# Patient Record
Sex: Female | Born: 1965 | Race: White | Hispanic: No | Smoking: Current every day smoker
Health system: Southern US, Community
[De-identification: ages and names within clinical notes are randomized; demographics above are authoritative.]

## PROBLEM LIST (undated history)

## (undated) DIAGNOSIS — E079 Disorder of thyroid, unspecified: Secondary | ICD-10-CM

## (undated) DIAGNOSIS — O009 Unspecified ectopic pregnancy without intrauterine pregnancy: Secondary | ICD-10-CM

## (undated) DIAGNOSIS — E039 Hypothyroidism, unspecified: Secondary | ICD-10-CM

## (undated) DIAGNOSIS — E7800 Pure hypercholesterolemia, unspecified: Secondary | ICD-10-CM

## (undated) HISTORY — PX: ECTOPIC PREGNANCY SURGERY: SHX613

## (undated) HISTORY — PX: CHOLECYSTECTOMY: SHX55

---

## 2015-06-12 ENCOUNTER — Other Ambulatory Visit (HOSPITAL_COMMUNITY): Payer: Self-pay | Admitting: Physician Assistant

## 2015-06-12 DIAGNOSIS — R928 Other abnormal and inconclusive findings on diagnostic imaging of breast: Secondary | ICD-10-CM

## 2015-06-12 DIAGNOSIS — N6310 Unspecified lump in the right breast, unspecified quadrant: Secondary | ICD-10-CM

## 2015-06-19 ENCOUNTER — Other Ambulatory Visit (HOSPITAL_COMMUNITY): Payer: Self-pay | Admitting: Physician Assistant

## 2015-06-19 DIAGNOSIS — N6489 Other specified disorders of breast: Secondary | ICD-10-CM

## 2015-06-19 DIAGNOSIS — R928 Other abnormal and inconclusive findings on diagnostic imaging of breast: Secondary | ICD-10-CM

## 2015-07-07 ENCOUNTER — Other Ambulatory Visit (HOSPITAL_COMMUNITY): Payer: Self-pay | Admitting: Physician Assistant

## 2015-07-07 ENCOUNTER — Ambulatory Visit (HOSPITAL_COMMUNITY)
Admission: RE | Admit: 2015-07-07 | Discharge: 2015-07-07 | Disposition: A | Discharge status: Home / Self Care | Payer: BC Managed Care – PPO | Source: Ambulatory Visit | Attending: Physician Assistant | Admitting: Physician Assistant

## 2015-07-07 DIAGNOSIS — R928 Other abnormal and inconclusive findings on diagnostic imaging of breast: Secondary | ICD-10-CM

## 2015-07-07 DIAGNOSIS — N6310 Unspecified lump in the right breast, unspecified quadrant: Secondary | ICD-10-CM

## 2015-07-07 DIAGNOSIS — N6001 Solitary cyst of right breast: Secondary | ICD-10-CM | POA: Insufficient documentation

## 2015-07-07 DIAGNOSIS — N6489 Other specified disorders of breast: Secondary | ICD-10-CM

## 2015-10-29 ENCOUNTER — Observation Stay (HOSPITAL_COMMUNITY)
Admission: EM | Admit: 2015-10-29 | Discharge: 2015-10-30 | Disposition: A | Discharge status: Home / Self Care | Payer: BC Managed Care – PPO | Attending: Internal Medicine | Admitting: Internal Medicine

## 2015-10-29 ENCOUNTER — Encounter (HOSPITAL_COMMUNITY): Payer: Self-pay | Admitting: Emergency Medicine

## 2015-10-29 ENCOUNTER — Emergency Department (HOSPITAL_COMMUNITY): Payer: BC Managed Care – PPO

## 2015-10-29 DIAGNOSIS — E785 Hyperlipidemia, unspecified: Secondary | ICD-10-CM | POA: Diagnosis not present

## 2015-10-29 DIAGNOSIS — E7800 Pure hypercholesterolemia, unspecified: Secondary | ICD-10-CM | POA: Diagnosis not present

## 2015-10-29 DIAGNOSIS — F17210 Nicotine dependence, cigarettes, uncomplicated: Secondary | ICD-10-CM | POA: Diagnosis not present

## 2015-10-29 DIAGNOSIS — Z9049 Acquired absence of other specified parts of digestive tract: Secondary | ICD-10-CM | POA: Insufficient documentation

## 2015-10-29 DIAGNOSIS — R079 Chest pain, unspecified: Principal | ICD-10-CM | POA: Diagnosis present

## 2015-10-29 DIAGNOSIS — Z7951 Long term (current) use of inhaled steroids: Secondary | ICD-10-CM | POA: Insufficient documentation

## 2015-10-29 DIAGNOSIS — Z8249 Family history of ischemic heart disease and other diseases of the circulatory system: Secondary | ICD-10-CM | POA: Diagnosis not present

## 2015-10-29 DIAGNOSIS — Z79899 Other long term (current) drug therapy: Secondary | ICD-10-CM | POA: Insufficient documentation

## 2015-10-29 DIAGNOSIS — F17200 Nicotine dependence, unspecified, uncomplicated: Secondary | ICD-10-CM | POA: Diagnosis present

## 2015-10-29 DIAGNOSIS — E039 Hypothyroidism, unspecified: Secondary | ICD-10-CM | POA: Diagnosis not present

## 2015-10-29 HISTORY — DX: Pure hypercholesterolemia, unspecified: E78.00

## 2015-10-29 HISTORY — DX: Disorder of thyroid, unspecified: E07.9

## 2015-10-29 HISTORY — DX: Unspecified ectopic pregnancy without intrauterine pregnancy: O00.90

## 2015-10-29 LAB — CBC
HEMATOCRIT: 42.4 % (ref 36.0–46.0)
Hemoglobin: 14.7 g/dL (ref 12.0–15.0)
MCH: 32.3 pg (ref 26.0–34.0)
MCHC: 34.7 g/dL (ref 30.0–36.0)
MCV: 93.2 fL (ref 78.0–100.0)
Platelets: 243 10*3/uL (ref 150–400)
RBC: 4.55 MIL/uL (ref 3.87–5.11)
RDW: 12.6 % (ref 11.5–15.5)
WBC: 9.5 10*3/uL (ref 4.0–10.5)

## 2015-10-29 LAB — BASIC METABOLIC PANEL
Anion gap: 10 (ref 5–15)
BUN: 14 mg/dL (ref 6–20)
CHLORIDE: 105 mmol/L (ref 101–111)
CO2: 24 mmol/L (ref 22–32)
CREATININE: 0.55 mg/dL (ref 0.44–1.00)
Calcium: 9.2 mg/dL (ref 8.9–10.3)
GFR calc Af Amer: >60 mL/min (ref >60)
GFR calc non Af Amer: >60 mL/min (ref >60)
GLUCOSE: 99 mg/dL (ref 65–99)
POTASSIUM: 3.9 mmol/L (ref 3.5–5.1)
Sodium: 139 mmol/L (ref 135–145)

## 2015-10-29 LAB — TROPONIN I: Troponin I: <0.03 ng/mL (ref <0.031)

## 2015-10-29 LAB — D-DIMER, QUANTITATIVE: D-Dimer, Quant: <0.27 ug/mL-FEU (ref 0.00–0.50)

## 2015-10-29 MED ORDER — LEVOTHYROXINE SODIUM 25 MCG PO TABS: 125.0000 ug | ORAL_TABLET | Freq: Every day | ORAL | Status: DC

## 2015-10-29 MED ORDER — ONDANSETRON HCL 4 MG/2ML IJ SOLN: 4.0000 mg | Freq: Four times a day (QID) | INTRAMUSCULAR | Status: DC | PRN

## 2015-10-29 MED ORDER — ASPIRIN EC 325 MG PO TBEC
325.0000 mg | DELAYED_RELEASE_TABLET | Freq: Every day | ORAL | Status: DC
  Administered 2015-10-29: 325 mg via ORAL
  Filled 2015-10-29 (×2): qty 1

## 2015-10-29 MED ORDER — ENOXAPARIN SODIUM 40 MG/0.4ML ~~LOC~~ SOLN
40.0000 mg | SUBCUTANEOUS | Status: DC
  Administered 2015-10-29: 40 mg via SUBCUTANEOUS
  Filled 2015-10-29: qty 0.4

## 2015-10-29 MED ORDER — SODIUM CHLORIDE 0.9 % IV SOLN
INTRAVENOUS | Status: DC
  Administered 2015-10-29: 18:00:00 via INTRAVENOUS

## 2015-10-29 MED ORDER — PRAVASTATIN SODIUM 40 MG PO TABS
40.0000 mg | ORAL_TABLET | Freq: Every day | ORAL | Status: DC
  Administered 2015-10-29: 40 mg via ORAL
  Filled 2015-10-29: qty 1

## 2015-10-29 MED ORDER — CITALOPRAM HYDROBROMIDE 20 MG PO TABS
20.0000 mg | ORAL_TABLET | Freq: Every day | ORAL | Status: DC
  Administered 2015-10-29: 20 mg via ORAL
  Filled 2015-10-29 (×2): qty 1

## 2015-10-29 MED ORDER — ASPIRIN 81 MG PO CHEW
324.0000 mg | CHEWABLE_TABLET | Freq: Once | ORAL | Status: AC
  Administered 2015-10-29: 324 mg via ORAL
  Filled 2015-10-29: qty 4

## 2015-10-29 MED ORDER — ACETAMINOPHEN 325 MG PO TABS: 650.0000 mg | ORAL_TABLET | ORAL | Status: DC | PRN

## 2015-10-29 NOTE — H&P (Signed)
[facility] History and Physical  Marie Taylor ZOX:[id number] DOB: 08-02-1966 DOA: 10/29/2015  Referring physician: Elson AreasLeslie K Sofia, PA PCP: Abran RichardBAUCOM, JENNY B, PA-C   Chief Complaint: chest pain  HPI: Marie Taylor is a 49 y.o. female with history of hyperlipidemia, tobacco use, family history of premature coronary disease presents with a hospital with complaints of chest pain. Patient reports intermittent chest pain for the past week lasting anywhere from a few minutes to 20 minutes. It is located in the left chest, radiating up into her neck and shoulder. She is not having associated shortness of breath or diaphoresis. She notes that her pain becomes worse on exertion and improves with rest. She was evaluated in the emergency room today and was subsequently referred for admission.   Review of Systems:  Pertinent positives as per HPI, otherwise negative  Past Medical History  Diagnosis Date  . Hypercholesteremia   . Thyroid disease     Hypothyroidism  . Ectopic pregnancy    Past Surgical History  Procedure Laterality Date  . Cholecystectomy    . Ectopic pregnancy surgery     Social History:  reports that she has been smoking Cigarettes.  She has been smoking about 0.50 packs per day. She does not have any smokeless tobacco history on file. She reports that she drinks alcohol. She reports that she does not use illicit drugs.  No Known Allergies  Family History  Problem Relation Age of Onset  . Coronary artery disease Sister [id number]    stent placement    Prior to Admission medications   Medication Sig Start Date End Date Taking? Authorizing Provider  albuterol (PROVENTIL HFA;VENTOLIN HFA) 108 (90 BASE) MCG/ACT inhaler Inhale 2 puffs into the lungs every 6 (six) hours as needed for wheezing or shortness of breath.   Yes Historical Provider, MD  citalopram (CELEXA) 20 MG tablet Take 20 mg by mouth daily.   Yes Historical Provider, MD  levothyroxine (SYNTHROID, LEVOTHROID) 125  MCG tablet Take 125 mcg by mouth daily before breakfast.   Yes Historical Provider, MD  pravastatin (PRAVACHOL) 40 MG tablet Take 40 mg by mouth daily.   Yes Historical Provider, MD   Physical Exam: Filed Vitals:   10/29/15 1158 10/29/15 1200 10/29/15 1230 10/29/15 1315  BP: 106/75 118/84 124/76 124/76  Pulse: 63 65 62 58  Temp: 98.2 F (36.8 C)  98.2 F (36.8 C) 98.2 F (36.8 C)  TempSrc: Oral     Resp: 14 19 20 16   Height:      Weight:      SpO2: 100% 100% 100% 100%    Wt Readings from Last 3 Encounters:  10/29/15 87.091 kg (192 lb)    General:  Appears calm and comfortable Eyes: PERRL, normal lids, irises & conjunctiva ENT: grossly normal hearing, lips & tongue Neck: no LAD, masses or thyromegaly Cardiovascular: RRR, no m/r/g. No LE edema. Telemetry: SR, no arrhythmias  Respiratory: CTA bilaterally, no w/r/r. Normal respiratory effort. Abdomen: soft, ntnd Skin: no rash or induration seen on limited exam Musculoskeletal: grossly normal tone BUE/BLE Psychiatric: grossly normal mood and affect, speech fluent and appropriate Neurologic: grossly non-focal.          Labs on Admission:  Basic Metabolic Panel:  Recent Labs Lab 10/29/15 0959  NA 139  K 3.9  CL 105  CO2 24  GLUCOSE 99  BUN 14  CREATININE 0.55  CALCIUM 9.2   Liver Function Tests: No results for input(s): AST, ALT, ALKPHOS, BILITOT, PROT, ALBUMIN in  the last 168 hours. No results for input(s): LIPASE, AMYLASE in the last 168 hours. No results for input(s): AMMONIA in the last 168 hours. CBC:  Recent Labs Lab 10/29/15 0959  WBC 9.5  HGB 14.7  HCT 42.4  MCV 93.2  PLT 243   Cardiac Enzymes:  Recent Labs Lab 10/29/15 0959 10/29/15 1321  TROPONINI <0.03 <0.03    BNP (last 3 results) No results for input(s): BNP in the last 8760 hours.  ProBNP (last 3 results) No results for input(s): PROBNP in the last 8760 hours.  CBG: No results for input(s): GLUCAP in the last 168  hours.  Radiological Exams on Admission: Dg Chest 2 View  10/29/2015  CLINICAL DATA:  Chest pain and hypertension EXAM: CHEST  2 VIEW COMPARISON:  None. FINDINGS: Lungs are clear. Heart size and pulmonary vascularity are normal. No adenopathy. No pneumothorax. No bone lesions. IMPRESSION: No edema or consolidation. Electronically Signed   By: Bretta Bang III M.D.   On: 10/29/2015 11:00    EKG: Independently reviewed. No acute changes  Assessment/Plan Principal Problem:   Chest pain with moderate risk of acute coronary syndrome Active Problems:   Dyslipidemia   Family history of coronary artery disease in sister   Smoker   Tobacco use disorder   Hypothyroidism   Chest pain   1. Chest pain. Patient has multiple risk factors for coronary disease including family history, tobacco use, hyperlipidemia. Cardiology has seen the patient. We'll monitor the patient overnight, cycle cardiac markers. Possible stress test in a.m. Will keep patient nothing by mouth after midnight. 2. Hyperlipidemia. Continue statin. 3. Hypothyroidism. Continue Synthroid. 4. Tobacco use. Counseled on the importance of tobacco cessation.    Code Status: full code DVT Prophylaxis: lovenox Family Communication: discussed with patient Disposition Plan: likely discharge tomorrow  Time spent:  [facility] Pager [phone]

## 2015-10-29 NOTE — ED Provider Notes (Signed)
CSN: [id number]     Arrival date & time 10/29/15  [id number] History   None    Chief Complaint  Patient presents with  . Chest Pain     (Consider location/radiation/quality/duration/timing/severity/associated sxs/prior Treatment) Patient is a 49 y.o. female presenting with chest pain. The history is provided by the patient. No language interpreter was used.  Chest Pain Pain location:  L chest Pain quality: aching   Pain radiates to:  Does not radiate Pain radiates to the back: no   Pain severity:  Moderate Onset quality:  Gradual Duration:  7 days Timing:  Constant Progression:  Worsening Chronicity:  New Context: not lifting and no movement   Relieved by:  Nothing Worsened by:  Nothing tried Ineffective treatments:  None tried Associated symptoms: no shortness of breath   Risk factors: high cholesterol and hypertension     Past Medical History  Diagnosis Date  . Hypercholesteremia   . Thyroid disease     Hypothyroidism  . Ectopic pregnancy    Past Surgical History  Procedure Laterality Date  . Cholecystectomy    . Ectopic pregnancy surgery     No family history on file. Social History  Substance Use Topics  . Smoking status: Current Every Day Smoker -- 0.50 packs/day    Types: Cigarettes  . Smokeless tobacco: None  . Alcohol Use: Yes   OB History    No data available     Review of Systems  Respiratory: Negative for shortness of breath.   Cardiovascular: Positive for chest pain.  All other systems reviewed and are negative.     Allergies  Review of patient's allergies indicates not on file.  Home Medications   Prior to Admission medications   Medication Sig Start Date End Date Taking? Authorizing Provider  citalopram (CELEXA) 20 MG tablet Take 20 mg by mouth daily.   Yes Historical Provider, MD  levothyroxine (SYNTHROID, LEVOTHROID) 125 MCG tablet Take 125 mcg by mouth daily before breakfast.   Yes Historical Provider, MD  pravastatin (PRAVACHOL) 40  MG tablet Take 40 mg by mouth daily.   Yes Historical Provider, MD   BP 145/77 mmHg  Pulse 74  Temp(Src) 97.7 F (36.5 C) (Oral)  Resp 16  Ht 5\' 8"  (1.727 m)  Wt 87.091 kg  BMI 29.20 kg/m2  SpO2 100% Physical Exam  Constitutional: She is oriented to person, place, and time. She appears well-developed and well-nourished.  HENT:  Head: Normocephalic and atraumatic.  Eyes: Conjunctivae and EOM are normal. Pupils are equal, round, and reactive to light.  Neck: Normal range of motion.  Cardiovascular: Normal rate and normal heart sounds.   Pulmonary/Chest: Effort normal and breath sounds normal.  Abdominal: She exhibits no distension.  Musculoskeletal: Normal range of motion.  Neurological: She is alert and oriented to person, place, and time.  Skin: Skin is warm.  Psychiatric: She has a normal mood and affect.  Nursing note and vitals reviewed.   ED Course  Procedures (including critical care time) Labs Review Labs Reviewed  BASIC METABOLIC PANEL  CBC  TROPONIN I  D-DIMER, QUANTITATIVE (NOT [facility])    Imaging Review Dg Chest 2 View  10/29/2015  CLINICAL DATA:  Chest pain and hypertension EXAM: CHEST  2 VIEW COMPARISON:  None. FINDINGS: Lungs are clear. Heart size and pulmonary vascularity are normal. No adenopathy. No pneumothorax. No bone lesions. IMPRESSION: No edema or consolidation. Electronically Signed   By: Bretta BangWilliam  Woodruff III M.D.   On: 10/29/2015 11:00  I have personally reviewed and evaluated these images and lab results as part of my medical decision-making.   EKG Interpretation None    normal sinus 69. Normal qrs, no st changes, normal pr.   MDM EKg normal, troponin negative, ddimer negative,    I spoke to Dr. Wyline Mood cardiology.  He advised he consult.   I spoke to  Dr. Kerry Hough  who will admit.   Final diagnoses:  Chest pain, unspecified chest pain type        Elson Areas, PA-C 10/29/15 1303  Vanetta Mulders, MD 10/29/15 1323

## 2015-10-29 NOTE — Consult Note (Signed)
Reason for Consult:   Chest pain  Requesting Physician: ED Primary Cardiologist New  HPI:   49 y/o Caucasian female with a history of treated dyslipidemia and smoking, seen in the ED today with complaints of Lt chest pain. The pt does have a family history of early CAD, her sister had CAD with stents placed in her 73's.  The pt says last week (Thursday) she had a "funny feeling" at work associated with diaphoresis. Reportedly her B/P was high "170". She has no history of HTN. She was sent home and has had waxing and waning Lt chest discomfort since. There is no exertional component. She has not taken anything for it. She decided to come to the ED today for further evaluation. EKG is normal, Troponin negative.  PMHx:  Past Medical History  Diagnosis Date  . Hypercholesteremia   . Thyroid disease     Hypothyroidism  . Ectopic pregnancy     Past Surgical History  Procedure Laterality Date  . Cholecystectomy    . Ectopic pregnancy surgery      SOCHx:  reports that she has been smoking Cigarettes.  She has been smoking about 0.50 packs per day. She does not have any smokeless tobacco history on file. She reports that she drinks alcohol. She reports that she does not use illicit drugs.  FAMHx: No family history on file.  ALLERGIES: Not on File  ROS: Review of Systems: General: negative for chills, fever, night sweats or weight changes.  Cardiovascular: negative for chest pain, dyspnea on exertion, edema, orthopnea, palpitations, paroxysmal nocturnal dyspnea or shortness of breath HEENT: negative for any visual disturbances, blindness, glaucoma Dermatological: negative for rash Respiratory: negative for cough, hemoptysis, or wheezing Urologic: negative for hematuria or dysuria Abdominal: negative for nausea, vomiting, diarrhea, bright red blood per rectum, melena, or hematemesis Neurologic: negative for visual changes, syncope, or dizziness Musculoskeletal:  negative for back pain, joint pain, or swelling Psych: cooperative and appropriate All other systems reviewed and are otherwise negative except as noted above.   HOME MEDICATIONS: Prior to Admission medications   Medication Sig Start Date End Date Taking? Authorizing Provider  albuterol (PROVENTIL HFA;VENTOLIN HFA) 108 (90 BASE) MCG/ACT inhaler Inhale 2 puffs into the lungs every 6 (six) hours as needed for wheezing or shortness of breath.   Yes Historical Provider, MD  citalopram (CELEXA) 20 MG tablet Take 20 mg by mouth daily.   Yes Historical Provider, MD  levothyroxine (SYNTHROID, LEVOTHROID) 125 MCG tablet Take 125 mcg by mouth daily before breakfast.   Yes Historical Provider, MD  pravastatin (PRAVACHOL) 40 MG tablet Take 40 mg by mouth daily.   Yes Historical Provider, MD    HOSPITAL MEDICATIONS: I have reviewed the patient's current medications.  VITALS: Blood pressure 118/84, pulse 65, temperature 98.2 F (36.8 C), temperature source Oral, resp. rate 19, height  (1.727 m), weight 192 lb (87.091 kg), SpO2 100 %.  PHYSICAL EXAM: General appearance: alert, cooperative, no distress and mildly obese Neck: no carotid bruit and no JVD Lungs: clear to auscultation bilaterally Heart: regular rate and rhythm, S1, S2 normal, no murmur, click, rub or gallop Abdomen: soft, non-tender; bowel sounds normal; no masses,  no organomegaly and RUQ surgical scar Extremities: extremities normal, atraumatic, no cyanosis or edema Pulses: 2+ and symmetric Skin: Skin color, texture, turgor normal. No rashes or lesions Neurologic: Grossly normal  LABS: Results for orders placed or performed during the hospital encounter of 10/29/15 (from  the past 24 hour(s))  Basic metabolic panel     Status: None   Collection Time: 10/29/15  9:59 AM  Result Value Ref Range   Sodium 139 135 - 145 mmol/L   Potassium 3.9 3.5 - 5.1 mmol/L   Chloride 105 101 - 111 mmol/L   CO2 24 22 - 32 mmol/L   Glucose, Bld  99 65 - 99 mg/dL   BUN 14 6 - 20 mg/dL   Creatinine, Ser 5.400.55 0.44 - 1.00 mg/dL   Calcium 9.2 8.9 - 98.110.3 mg/dL   GFR calc non Af Amer >60 >60 mL/min   GFR calc Af Amer >60 >60 mL/min   Anion gap 10 5 - 15  CBC     Status: None   Collection Time: 10/29/15  9:59 AM  Result Value Ref Range   WBC 9.5 4.0 - 10.5 K/uL   RBC 4.55 3.87 - 5.11 MIL/uL   Hemoglobin 14.7 12.0 - 15.0 g/dL   HCT 19.142.4 47.836.0 - 29.546.0 %   MCV 93.2 78.0 - 100.0 fL   MCH 32.3 26.0 - 34.0 pg   MCHC 34.7 30.0 - 36.0 g/dL   RDW 62.112.6 30.811.5 - 65.715.5 %   Platelets 243 150 - 400 K/uL  Troponin I     Status: None   Collection Time: 10/29/15  9:59 AM  Result Value Ref Range   Troponin I <0.03 <0.031 ng/mL  D-dimer, quantitative (not at [facility])     Status: None   Collection Time: 10/29/15 10:55 AM  Result Value Ref Range   D-Dimer, Quant <0.27 0.00 - 0.50 ug/mL-FEU    EKG: NSR  IMAGING: Dg Chest 2 View  10/29/2015  CLINICAL DATA:  Chest pain and hypertension EXAM: CHEST  2 VIEW COMPARISON:  None. FINDINGS: Lungs are clear. Heart size and pulmonary vascularity are normal. No adenopathy. No pneumothorax. No bone lesions. IMPRESSION: No edema or consolidation. Electronically Signed   By: Bretta BangWilliam  Woodruff III M.D.   On: 10/29/2015 11:00    IMPRESSION: Principal Problem:   Chest pain with moderate risk of acute coronary syndrome Active Problems:   Dyslipidemia   Family history of coronary artery disease in sister   Smoker   RECOMMENDATION: HEART Score is 3-4 (some typical, some atypical history). Rule out overnight, anticipate likely stress tomorrow AM>  MD to see  Time Spent Directly with Patient: 35 minutes  Corine ShelterLuke Kilroy, [place]  [phone] beeper 10/29/2015, 12:51 PM   Attending note  Patient seen and discussed with PA Diona FantiKilroy, I agree with his documentation. Patient with multiple CAD risk factors admitted with chest pain. Anette RiedelWei will cycle cardiac enzymes and EKGs overnight, pending results will consider stress test  tomorrow.   Dominga FerryJ Henna Derderian MD

## 2015-10-29 NOTE — ED Notes (Addendum)
Report given to Barbara CowerJason, RN for room 310.

## 2015-10-29 NOTE — ED Notes (Signed)
States intermittent chest pain x 1 week. Episodes of tightness that are associated with HTN and dizziness. Pain radiates to left arm and left side of neck.

## 2015-10-30 ENCOUNTER — Observation Stay (HOSPITAL_COMMUNITY): Payer: BC Managed Care – PPO

## 2015-10-30 ENCOUNTER — Encounter (HOSPITAL_COMMUNITY): Payer: Self-pay

## 2015-10-30 ENCOUNTER — Observation Stay (HOSPITAL_BASED_OUTPATIENT_CLINIC_OR_DEPARTMENT_OTHER): Payer: BC Managed Care – PPO

## 2015-10-30 DIAGNOSIS — E039 Hypothyroidism, unspecified: Secondary | ICD-10-CM | POA: Diagnosis not present

## 2015-10-30 DIAGNOSIS — E785 Hyperlipidemia, unspecified: Secondary | ICD-10-CM | POA: Diagnosis not present

## 2015-10-30 DIAGNOSIS — Z8249 Family history of ischemic heart disease and other diseases of the circulatory system: Secondary | ICD-10-CM | POA: Diagnosis not present

## 2015-10-30 DIAGNOSIS — R079 Chest pain, unspecified: Secondary | ICD-10-CM | POA: Diagnosis not present

## 2015-10-30 LAB — NM MYOCAR MULTI W/SPECT W/WALL MOTION / EF
CHL CUP MPHR: 171 {beats}/min
CHL CUP NUCLEAR SDS: 3
CHL CUP NUCLEAR SRS: 15
CHL CUP NUCLEAR SSS: 18
CSEPEW: 9.9 METS
CSEPHR: 88 %
Exercise duration (min): 7 min
Exercise duration (sec): 45 s
LHR: 0.26
LV sys vol: 40 mL
LVDIAVOL: 75 mL
Peak HR: 151 {beats}/min
RPE: 17
Rest HR: 64 {beats}/min
TID: 0.87

## 2015-10-30 LAB — TROPONIN I: Troponin I: <0.03 ng/mL (ref <0.031)

## 2015-10-30 MED ORDER — SODIUM CHLORIDE 0.9 % IJ SOLN
INTRAMUSCULAR | Status: AC
  Administered 2015-10-30: 10 mL via INTRAVENOUS
  Filled 2015-10-30: qty 3

## 2015-10-30 MED ORDER — TECHNETIUM TC 99M SESTAMIBI - CARDIOLITE
30.0000 | Freq: Once | INTRAVENOUS | Status: AC | PRN
  Administered 2015-10-30: 10:00:00 30 via INTRAVENOUS

## 2015-10-30 MED ORDER — FAMOTIDINE 20 MG PO TABS: 20.0000 mg | ORAL_TABLET | Freq: Two times a day (BID) | ORAL | Status: DC

## 2015-10-30 MED ORDER — TECHNETIUM TC 99M SESTAMIBI GENERIC - CARDIOLITE
10.0000 | Freq: Once | INTRAVENOUS | Status: AC | PRN
  Administered 2015-10-30: 10 via INTRAVENOUS

## 2015-10-30 MED ORDER — REGADENOSON 0.4 MG/5ML IV SOLN
INTRAVENOUS | Status: AC
  Filled 2015-10-30: qty 5

## 2015-10-30 NOTE — Progress Notes (Signed)
Patient discharged in stable condition, IV removed intact.  Patient left with all belongings with husband.

## 2015-10-30 NOTE — Discharge Summary (Signed)
Physician Discharge Summary  Marie Taylor ZOX:[id number] DOB: 27-Jul-1966 DOA: 10/29/2015  PCP: Abran RichardBAUCOM, JENNY B, PA-C  Admit date: 10/29/2015 Discharge date: 10/30/2015  Time spent: 25 minutes  Recommendations for Outpatient Follow-up:  1. Follow up with PCP in 1-2 weeks. 2. Follow up with cardiology as needed   Discharge Diagnoses:  Principal Problem:   Chest pain with moderate risk of acute coronary syndrome Active Problems:   Dyslipidemia   Family history of coronary artery disease in sister   Smoker   Tobacco use disorder   Hypothyroidism   Chest pain   Discharge Condition: Improved  Diet recommendation: heart healthy  Filed Weights   10/29/15 1001  Weight: 87.091 kg (192 lb)    History of present illness:  49 y.o. female with history of hyperlipidemia, tobacco use, family history of premature coronary disease presents with a hospital with complaints of chest pain. Patient reports intermittent chest pain for the past week lasting anywhere from a few minutes to 20 minutes. It is located in the left chest, radiating up into her neck and shoulder. She is not having associated shortness of breath or diaphoresis. She notes that her pain becomes worse on exertion and improves with rest. She was evaluated in the emergency room and referred for admission.   Hospital Course:  Patient presented with chest pain. Given her multiple risk factors for coronary disease including family history, tobacco use, hyperlipidemia, she was admitted for observation. Serial cardiac markers were all negative and her EKG was non-acute. Exercise nuclear stress test revealed no evidence of ischemia. Cardiology did not believe her pain was cardiac in nature. They recommend starting a H2 blocker for possible GI etiology. Patient's chest pain has resolved and she is ready for discharge.    1. Hyperlipidemia. Continue home meds. 2. Hypothyroidism. Continue Synthroid. 3. Tobacco use. Counseled on the  importance of tobacco cessation.  Procedures:   none  Consultations:  Cardiology  Discharge Exam: Filed Vitals:   10/30/15 0147 10/30/15 0540  BP: 117/66 119/65  Pulse: 63 60  Temp: 98.5 F (36.9 C) 98.4 F (36.9 C)  Resp: 20 20     General: NAD, looks comfortable  Cardiovascular: RRR, S1, S2   Respiratory: clear bilaterally, No wheezing, rales or rhonchi  Abdomen: soft, non tender, no distention , bowel sounds normal  Musculoskeletal: No edema b/l   Discharge Instructions   Discharge Instructions    Diet - low sodium heart healthy    Complete by:  As directed      Increase activity slowly    Complete by:  As directed           Current Discharge Medication List    START taking these medications   Details  famotidine (PEPCID) 20 MG tablet Take 1 tablet (20 mg total) by mouth 2 (two) times daily. Qty: 30 tablet, Refills: 0      CONTINUE these medications which have NOT CHANGED   Details  albuterol (PROVENTIL HFA;VENTOLIN HFA) 108 (90 BASE) MCG/ACT inhaler Inhale 2 puffs into the lungs every 6 (six) hours as needed for wheezing or shortness of breath.    citalopram (CELEXA) 20 MG tablet Take 20 mg by mouth daily.    levothyroxine (SYNTHROID, LEVOTHROID) 125 MCG tablet Take 125 mcg by mouth daily before breakfast.    pravastatin (PRAVACHOL) 40 MG tablet Take 40 mg by mouth daily.       No Known Allergies    The results of significant diagnostics from this hospitalization (including  imaging, microbiology, ancillary and laboratory) are listed below for reference.    Significant Diagnostic Studies: Dg Chest 2 View  10/29/2015  CLINICAL DATA:  Chest pain and hypertension EXAM: CHEST  2 VIEW COMPARISON:  None. FINDINGS: Lungs are clear. Heart size and pulmonary vascularity are normal. No adenopathy. No pneumothorax. No bone lesions. IMPRESSION: No edema or consolidation. Electronically Signed   By: Bretta Bang III M.D.   On: 10/29/2015 11:00    Nm Myocar Multi W/spect W/wall Motion / Ef  10/30/2015   There was no ST segment deviation noted during stress.  The study is normal. There are no perfusion defects  This is a low risk study.  The left ventricular ejection fraction is mildly decreased (45-54%). Calculated LVEF just mildly decreased at 45%. This may be due to a processing issue, if clinically indicated can consider echo to further evaluate.  Duke treadmill score of 8, consistent with low risk for major cardiac events  Very good exercise tolerance (120% of predicted based on age and gender)       Labs: Basic Metabolic Panel:  Recent Labs Lab 10/29/15 0959  NA 139  K 3.9  CL 105  CO2 24  GLUCOSE 99  BUN 14  CREATININE 0.55  CALCIUM 9.2    CBC:  Recent Labs Lab 10/29/15 0959  WBC 9.5  HGB 14.7  HCT 42.4  MCV 93.2  PLT 243   Cardiac Enzymes:  Recent Labs Lab 10/29/15 0959 10/29/15 1321 10/29/15 1757 10/29/15 2037 10/30/15 0038  TROPONINI <0.03 <0.03 <0.03 <0.03 <0.03     Signed: Durward Mallard Memon. MD  [facility] 10/30/2015, 12:32 PM   By signing my name below, I, Burnett Harry, attest that this documentation has been prepared under the direction and in the presence of [facility]. MD Electronically Signed: Burnett Harry, Scribe. 10/30/2015  I, Dr. Erick Blinks, personally performed the services described in this documentaiton. All medical record entries made by the scribe were at my direction and in my presence. I have reviewed the chart and agree that the record reflects my personal performance and is accurate and complete  Erick Blinks, MD, 10/30/2015 12:32 PM

## 2015-10-30 NOTE — Progress Notes (Signed)
    Consulting cardiologist:Trev Boley, Christiane HaJonathan MD Primary Cardiologist: Dina RichBranch, Ita Fritzsche MD  Cardiology Specific Problem List: 1.Chest Pain  Subjective:    Some sharp pain overnight. Transient. Patient assessed in stress lab prior to MPI test.   Objective:   Temp:  [98.2 F (36.8 C)-98.6 F (37 C)] 98.4 F (36.9 C) (12/16 0540) Pulse Rate:  [58-66] 60 (12/16 0540) Resp:  [14-20] 20 (12/16 0540) BP: (106-134)/(65-84) 119/65 mmHg (12/16 0540) SpO2:  [95 %-100 %] 98 % (12/16 0540) Last BM Date: 10/29/15  Filed Weights   10/29/15 1001  Weight: 192 lb (87.091 kg)    Intake/Output Summary (Last 24 hours) at 10/30/15 1015 Last data filed at 10/30/15 0856  Gross per 24 hour  Intake    240 ml  Output      0 ml  Net    240 ml    Telemetry: NSR  Exam:  General: No acute distress.  HEENT: Conjunctiva and lids normal, oropharynx clear.  Lungs: Clear to auscultation, nonlabored.  Cardiac: No elevated JVP or bruits. RRR, no gallop or rub.   Abdomen: Normoactive bowel sounds, nontender, nondistended.  Extremities: No pitting edema, distal pulses full.  Neuropsychiatric: Alert and oriented x3, affect appropriate.   Lab Results:  Basic Metabolic Panel:  Recent Labs Lab 10/29/15 0959  NA 139  K 3.9  CL 105  CO2 24  GLUCOSE 99  BUN 14  CREATININE 0.55  CALCIUM 9.2    CBC:  Recent Labs Lab 10/29/15 0959  WBC 9.5  HGB 14.7  HCT 42.4  MCV 93.2  PLT 243    Cardiac Enzymes:  Recent Labs Lab 10/29/15 1757 10/29/15 2037 10/30/15 0038  TROPONINI <0.03 <0.03 <0.03    Radiology: Dg Chest 2 View  10/29/2015  CLINICAL DATA:  Chest pain and hypertension EXAM: CHEST  2 VIEW COMPARISON:  None. FINDINGS: Lungs are clear. Heart size and pulmonary vascularity are normal. No adenopathy. No pneumothorax. No bone lesions. IMPRESSION: No edema or consolidation. Electronically Signed   By: Bretta BangWilliam  Woodruff III M.D.   On: 10/29/2015 11:00     Medications:     Scheduled Medications: . aspirin EC  325 mg Oral Daily  . citalopram  20 mg Oral Daily  . enoxaparin (LOVENOX) injection  40 mg Subcutaneous Q24H  . levothyroxine  125 mcg Oral QAC breakfast  . pravastatin  40 mg Oral q1800    Infusions: . sodium chloride 50 mL/hr at 10/29/15 1817    PRN Medications: acetaminophen, ondansetron (ZOFRAN) IV, technetium sestamibi   Assessment and Plan:   1.Chest Pain: Nuclear scintigraphy to follow. No chest pain on treadmill. Some dyspnea at maximum HR. More recommendations after results.   2. Hypercholestrolemia; Continue statin.   Bettey MareKathryn M. Lawrence NP [facility]  10/30/2015, 10:15 AM   Attending note Exercise nuclear stress this AM without evidence of ischemia, low risk Duke treadmill score and very good exercise tolerance. No evidence symptoms are cardiac related. Consider trial course of H2 blocker for possible GI etiology. She may f/u with her pcp for further evaluation of noncardiac chest pain, may f/u with us as needed in the future.   Dominga FerryJ Raechal Raben MD

## 2015-10-30 NOTE — Plan of Care (Signed)
Problem: Safety: Goal: Ability to remain free from injury will improve Outcome: Progressing Pt remains free of injury.  Problem: Pain Managment: Goal: General experience of comfort will improve Outcome: Progressing Pt denies pain.  Problem: Physical Regulation: Goal: Ability to maintain clinical measurements within normal limits will improve Outcome: Progressing Pt's vitals are stable. See flowsheet.   Problem: Tissue Perfusion: Goal: Risk factors for ineffective tissue perfusion will decrease Outcome: Progressing Pt receiving Lovenox for VTE.

## 2015-10-30 NOTE — Care Management Note (Signed)
Case Management Note  Patient Details  Name: Marie Taylor MRN: [id number] Date of Birth: 03/19/1966  Subjective/Objective:                  Pt admitted from home with CP. Pt lives with family and will return home at discharge. Pt is independent with ADL's.  Action/Plan: No CM needs noted. Anticipate discharge home today.  Expected Discharge Date:                  Expected Discharge Plan:  Home/Self Care  In-House Referral:  NA  Discharge planning Services  CM Consult  Post Acute Care Choice:  NA Choice offered to:  NA  DME Arranged:    DME Agency:     HH Arranged:    HH Agency:     Status of Service:  Completed, signed off  Medicare Important Message Given:    Date Medicare IM Given:    Medicare IM give by:    Date Additional Medicare IM Given:    Additional Medicare Important Message give by:     If discussed at Long Length of Stay Meetings, dates discussed:    Additional Comments:  Cheryl FlashBlackwell, Kadden Osterhout Crowder, RN 10/30/2015, 12:24 PM

## 2016-02-22 ENCOUNTER — Encounter (INDEPENDENT_AMBULATORY_CARE_PROVIDER_SITE_OTHER): Payer: Self-pay | Admitting: *Deleted

## 2016-03-16 ENCOUNTER — Ambulatory Visit (INDEPENDENT_AMBULATORY_CARE_PROVIDER_SITE_OTHER): Payer: BC Managed Care – PPO | Admitting: Internal Medicine

## 2016-03-16 ENCOUNTER — Encounter (INDEPENDENT_AMBULATORY_CARE_PROVIDER_SITE_OTHER): Payer: Self-pay | Admitting: Internal Medicine

## 2016-03-21 ENCOUNTER — Encounter (INDEPENDENT_AMBULATORY_CARE_PROVIDER_SITE_OTHER): Payer: Self-pay

## 2016-03-21 ENCOUNTER — Ambulatory Visit (INDEPENDENT_AMBULATORY_CARE_PROVIDER_SITE_OTHER): Payer: BC Managed Care – PPO | Admitting: Internal Medicine

## 2016-03-21 ENCOUNTER — Encounter (INDEPENDENT_AMBULATORY_CARE_PROVIDER_SITE_OTHER): Payer: Self-pay | Admitting: *Deleted

## 2016-03-21 ENCOUNTER — Other Ambulatory Visit (INDEPENDENT_AMBULATORY_CARE_PROVIDER_SITE_OTHER): Payer: Self-pay | Admitting: Internal Medicine

## 2016-03-21 ENCOUNTER — Other Ambulatory Visit (INDEPENDENT_AMBULATORY_CARE_PROVIDER_SITE_OTHER): Payer: Self-pay | Admitting: *Deleted

## 2016-03-21 ENCOUNTER — Encounter (INDEPENDENT_AMBULATORY_CARE_PROVIDER_SITE_OTHER): Payer: Self-pay | Admitting: Internal Medicine

## 2016-03-21 VITALS — BP 124/82 | HR 76 | Temp 98.2°F | Ht 68.0 in | Wt 195.5 lb

## 2016-03-21 DIAGNOSIS — K625 Hemorrhage of anus and rectum: Secondary | ICD-10-CM | POA: Diagnosis not present

## 2016-03-21 NOTE — Patient Instructions (Signed)
Colonoscopy.  The risks and benefits such as perforation, bleeding, and infection were reviewed with the patient and is agreeable. 

## 2016-03-21 NOTE — Progress Notes (Addendum)
   Subjective:    Patient ID: Marie Taylor, female    DOB: May 14, 1966, 50 y.o.   MRN: [id number]  [facility] bleeding Boneta Lucks(Jenny B Baucom PA-C).  In April she experienced rectal bleeding which was jelly like. She says it was cloggy like. She had abdominal cramnps. She says she had rectal bleeding for about 2 weeks.  She says every now and then she will have some rectal bleeding.  Appetite is good. No weight loss. She usually has a BM x 4 a day. Usually formed. Sometimes in the morning her stools are looose. CBC was normal per patient. 05/24/2010 Colonoscopy: intermittent episodes or rectal bleeding with abdominal cramps and urge to have a BM:  Normal terminal ileum. Few scattered diverticula at sigmoid colon, descending and transverse. A few focal areas with erythema and edema biopsied. External hemorrhoids. Biopsy: Hyperemic colonic epithelium.   02/17/2016 H and H 12.3 AND 428.8, MCV 92.8  Review of Systems Past Medical History  Diagnosis Date  . Hypercholesteremia   . Thyroid disease     Hypothyroidism  . Ectopic pregnancy     Past Surgical History  Procedure Laterality Date  . Cholecystectomy    . Ectopic pregnancy surgery      Allergies  Allergen Reactions  . Aspirin     hives  . Ketorolac Tromethamine     hives  . Nitrofurantoin     hives    Current Outpatient Prescriptions on File Prior to Visit  Medication Sig Dispense Refill  . albuterol (PROVENTIL HFA;VENTOLIN HFA) 108 (90 BASE) MCG/ACT inhaler Inhale 2 puffs into the lungs every 6 (six) hours as needed for wheezing or shortness of breath.    . citalopram (CELEXA) 20 MG tablet Take 20 mg by mouth daily.    Marland Kitchen. levothyroxine (SYNTHROID, LEVOTHROID) 125 MCG tablet Take 125 mcg by mouth daily before breakfast.    . pravastatin (PRAVACHOL) 40 MG tablet Take 40 mg by mouth daily.     No current facility-administered medications on file prior to visit.        Objective:   Physical Exam Blood pressure 124/82, pulse 76, temperature 98.2 F (36.8 C), height 5\' 8"  (1.727 m), weight 195 lb 8 oz (88.678 kg), last menstrual period 09/24/2015. Alert and oriented. Skin warm and dry. Oral mucosa is moist.   . Sclera anicteric, conjunctivae is pink. Thyroid not enlarged. No cervical lymphadenopathy. Lungs clear. Heart regular rate and rhythm.  Abdomen is soft. Bowel sounds are positive. No hepatomegaly. No abdominal masses felt. No tenderness.  No edema to lower extremities.  Stool brown and guaiac negative.    Lot [id number] Ex 9/17     Assessment & Plan:  Rectal bleeding. ? Hemorrhoidal. Colonic neoplasm,diverticular bleed needs to be ruled out. Colonoscopy. The risks and benefits such as perforation, bleeding, and infection were reviewed with the patient and is agreeable.

## 2016-03-21 NOTE — Telephone Encounter (Signed)
Patient needs trilyte 

## 2016-03-22 MED ORDER — PEG 3350-KCL-NA BICARB-NACL 420 G PO SOLR: 4000.0000 mL | Freq: Once | ORAL | Status: DC

## 2016-04-04 ENCOUNTER — Encounter (INDEPENDENT_AMBULATORY_CARE_PROVIDER_SITE_OTHER): Payer: Self-pay

## 2016-04-22 ENCOUNTER — Ambulatory Visit (HOSPITAL_COMMUNITY)
Admission: RE | Admit: 2016-04-22 | Discharge: 2016-04-22 | Disposition: A | Discharge status: Home / Self Care | Payer: BC Managed Care – PPO | Source: Ambulatory Visit | Attending: Internal Medicine | Admitting: Internal Medicine

## 2016-04-22 ENCOUNTER — Encounter (HOSPITAL_COMMUNITY): Payer: Self-pay | Admitting: *Deleted

## 2016-04-22 ENCOUNTER — Encounter (HOSPITAL_COMMUNITY)
Admission: RE | Disposition: A | Discharge status: Home / Self Care | Payer: Self-pay | Source: Ambulatory Visit | Attending: Internal Medicine

## 2016-04-22 DIAGNOSIS — E039 Hypothyroidism, unspecified: Secondary | ICD-10-CM | POA: Insufficient documentation

## 2016-04-22 DIAGNOSIS — K625 Hemorrhage of anus and rectum: Secondary | ICD-10-CM | POA: Diagnosis not present

## 2016-04-22 DIAGNOSIS — E7800 Pure hypercholesterolemia, unspecified: Secondary | ICD-10-CM | POA: Diagnosis not present

## 2016-04-22 DIAGNOSIS — F17210 Nicotine dependence, cigarettes, uncomplicated: Secondary | ICD-10-CM | POA: Diagnosis not present

## 2016-04-22 DIAGNOSIS — K644 Residual hemorrhoidal skin tags: Secondary | ICD-10-CM

## 2016-04-22 DIAGNOSIS — K5730 Diverticulosis of large intestine without perforation or abscess without bleeding: Secondary | ICD-10-CM

## 2016-04-22 DIAGNOSIS — K6289 Other specified diseases of anus and rectum: Secondary | ICD-10-CM | POA: Diagnosis not present

## 2016-04-22 DIAGNOSIS — Z79899 Other long term (current) drug therapy: Secondary | ICD-10-CM | POA: Insufficient documentation

## 2016-04-22 HISTORY — PX: COLONOSCOPY: SHX5424

## 2016-04-22 HISTORY — DX: Hypothyroidism, unspecified: E03.9

## 2016-04-22 SURGERY — COLONOSCOPY
Anesthesia: Moderate Sedation

## 2016-04-22 MED ORDER — STERILE WATER FOR IRRIGATION IR SOLN
Status: DC | PRN
  Administered 2016-04-22: 09:00:00

## 2016-04-22 MED ORDER — MEPERIDINE HCL 50 MG/ML IJ SOLN
INTRAMUSCULAR | Status: DC
Start: 2016-04-22 — End: 2016-04-22
  Filled 2016-04-22: qty 1

## 2016-04-22 MED ORDER — MIDAZOLAM HCL 5 MG/5ML IJ SOLN
INTRAMUSCULAR | Status: AC
  Filled 2016-04-22: qty 10

## 2016-04-22 MED ORDER — MIDAZOLAM HCL 5 MG/5ML IJ SOLN
INTRAMUSCULAR | Status: DC | PRN
  Administered 2016-04-22: 3 mg via INTRAVENOUS
  Administered 2016-04-22 (×2): 2 mg via INTRAVENOUS
  Administered 2016-04-22: 1 mg via INTRAVENOUS

## 2016-04-22 MED ORDER — SODIUM CHLORIDE 0.9 % IV SOLN
INTRAVENOUS | Status: DC
  Administered 2016-04-22: 09:00:00 via INTRAVENOUS

## 2016-04-22 MED ORDER — MEPERIDINE HCL 50 MG/ML IJ SOLN
INTRAMUSCULAR | Status: DC | PRN
  Administered 2016-04-22 (×2): 25 mg via INTRAVENOUS

## 2016-04-22 NOTE — Discharge Instructions (Signed)
Resume usual medications and high fiber diet. No driving for 24 hours. Call office if you have another episode of rectal bleeding and abdominal pain.  Colonoscopy, Care After Refer to this sheet in the next few weeks. These instructions provide you with information on caring for yourself after your procedure. Your health care provider may also give you more specific instructions. Your treatment has been planned according to current medical practices, but problems sometimes occur. Call your health care provider if you have any problems or questions after your procedure. WHAT TO EXPECT AFTER THE PROCEDURE  After your procedure, it is typical to have the following:  A small amount of blood in your stool.  Moderate amounts of gas and mild abdominal cramping or bloating. HOME CARE INSTRUCTIONS  Do not drive, operate machinery, or sign important documents for 24 hours.  You may shower and resume your regular physical activities, but move at a slower pace for the first 24 hours.  Take frequent rest periods for the first 24 hours.  Walk around or put a warm pack on your abdomen to help reduce abdominal cramping and bloating.  Drink enough fluids to keep your urine clear or pale yellow.  You may resume your normal diet as instructed by your health care provider. Avoid heavy or fried foods that are hard to digest.  Avoid drinking alcohol for 24 hours or as instructed by your health care provider.  Only take over-the-counter or prescription medicines as directed by your health care provider.  If a tissue sample (biopsy) was taken during your procedure:  Do not take aspirin or blood thinners for 7 days, or as instructed by your health care provider.  Do not drink alcohol for 7 days, or as instructed by your health care provider.  Eat soft foods for the first 24 hours. SEEK MEDICAL CARE IF: You have persistent spotting of blood in your stool 2-3 days after the procedure. SEEK IMMEDIATE  MEDICAL CARE IF:  You have more than a small spotting of blood in your stool.  You pass large blood clots in your stool.  Your abdomen is swollen (distended).  You have nausea or vomiting.  You have a fever.  You have increasing abdominal pain that is not relieved with medicine.   This information is not intended to replace advice given to you by your health care provider. Make sure you discuss any questions you have with your health care provider.   Document Released: 06/14/2004 Document Revised: 08/21/2013 Document Reviewed: 07/08/2013 Elsevier Interactive Patient Education [employer]2016 Elsevier Inc.

## 2016-04-22 NOTE — H&P (Signed)
Marie Taylor is an 50 y.o. female.   Chief Complaint: Patient is here for colonoscopy. HPI: She is 50 year old Caucasian female who presents with history of intermittent rectal bleeding. She's had 3 episodes in the last 6 months. Last episode occurred one month ago and lasted for 3-4 days. Each episode starts with the sensation to have a bowel movement and is associated with severe cramping and rectal bleeding and she generally gets better within few days. She denies diarrhea or constipation. She also denies weight loss. He smokes cigarettes. She takes Excedrin when necessary for headache. Last colonoscopy was about 6 years ago. Family history is negative for CRC.  Past Medical History  Diagnosis Date  . Hypercholesteremia   . Thyroid disease     Hypothyroidism  . Ectopic pregnancy   . Hypothyroidism     Past Surgical History  Procedure Laterality Date  . Cholecystectomy    . Ectopic pregnancy surgery      Family History  Problem Relation Age of Onset  . Coronary artery disease Sister [id number]    stent placement   Social History:  reports that she has been smoking Cigarettes.  She has a 34 pack-year smoking history. She does not have any smokeless tobacco history on file. She reports that she drinks alcohol. She reports that she does not use illicit drugs.  Allergies:  Allergies  Allergen Reactions  . Aspirin     hives  . Ketorolac Tromethamine     hives  . Nitrofurantoin     hives    Medications Prior to Admission  Medication Sig Dispense Refill  . levothyroxine (SYNTHROID, LEVOTHROID) 125 MCG tablet Take 125 mcg by mouth daily before breakfast.    . polyethylene glycol-electrolytes (NULYTELY/GOLYTELY) 420 g solution Take 4,000 mLs by mouth once. 4000 mL 0  . albuterol (PROVENTIL HFA;VENTOLIN HFA) 108 (90 BASE) MCG/ACT inhaler Inhale 2 puffs into the lungs every 6 (six) hours as needed for wheezing or shortness of breath.    . citalopram (CELEXA) 20 MG tablet Take 20 mg by  mouth daily.    . pravastatin (PRAVACHOL) 40 MG tablet Take 40 mg by mouth daily.      No results found for this or any previous visit (from the past 48 hour(s)). No results found.  ROS  Blood pressure 131/84, pulse 66, temperature 97.6 F (36.4 C), temperature source Oral, resp. rate 11, height 5\' 8"  (1.727 m), weight 193 lb (87.544 kg), last menstrual period 09/24/2015, SpO2 100 %. Physical Exam  Constitutional: She appears well-developed and well-nourished.  HENT:  Mouth/Throat: Oropharynx is clear and moist.  Eyes: Conjunctivae are normal. No scleral icterus.  Neck: No thyromegaly present.  Cardiovascular: Normal rate, regular rhythm and normal heart sounds.   No murmur heard. Respiratory: Effort normal and breath sounds normal.  GI: Soft. She exhibits no distension and no mass. There is no tenderness.  Musculoskeletal: She exhibits no edema.  Lymphadenopathy:    She has no cervical adenopathy.  Neurological: She is alert.  Skin: Skin is warm and dry.     Assessment/Plan Rectal bleeding. Diagnostic colonoscopy.  Lionel DecemberNajeeb Fortunato Nordin, MD 04/22/2016, 9:22 AM

## 2016-04-22 NOTE — Op Note (Signed)
[facility] Patient Name: Marie Taylor Procedure Date: 04/22/2016 9:10 AM MRN: [id number] Date of Birth: [birth year] Attending MD: Lionel December , MD CSN: [id number] Age: 50 Admit Type: Outpatient Procedure:                Colonoscopy Indications:              Rectal bleeding Providers:                Lionel December, MD, Edrick Kins, RN, Gearldine Shown,                            Technologist Referring MD:             Shaune Pollack. Baucom, PA-C Medicines:                Meperidine 50 mg IV, Midazolam 8 mg IV Complications:            No immediate complications. Estimated Blood Loss:     Estimated blood loss: none. Procedure:                Pre-Anesthesia Assessment:                           - Prior to the procedure, a History and Physical                            was performed, and patient medications and                            allergies were reviewed. The patient's tolerance of                            previous anesthesia was also reviewed. The risks                            and benefits of the procedure and the sedation                            options and risks were discussed with the patient.                            All questions were answered, and informed consent                            was obtained. Prior Anticoagulants: The patient has                            taken no previous anticoagulant or antiplatelet                            agents. ASA Grade Assessment: I - A normal, healthy                            patient. After reviewing the risks and benefits,  the patient was deemed in satisfactory condition to                            undergo the procedure.                           After obtaining informed consent, the colonoscope                            was passed under direct vision. Throughout the                            procedure, the patient's blood pressure, pulse, and                            oxygen saturations  were monitored continuously. The                            [id number] ([id number]) scope was introduced through                            the anus and advanced to the the cecum, identified                            by appendiceal orifice and ileocecal valve. The                            colonoscopy was performed without difficulty. The                            patient tolerated the procedure well. The quality                            of the bowel preparation was good. The ileocecal                            valve, appendiceal orifice, and rectum were                            photographed. Scope In: 9:32:17 AM Scope Out: 9:48:29 AM Total Procedure Duration: 0 hours 16 minutes 12 seconds  Findings:      Scattered small-mouthed diverticula were found in the sigmoid colon,       descending colon and hepatic flexure.      External hemorrhoids were found during retroflexion. The hemorrhoids       were small.      Anal papilla(e) were hypertrophied. Impression:               - Diverticulosis in the sigmoid colon, in the                            descending colon and at the hepatic flexure.                           - External hemorrhoids.                           -  Anal papilla(e) were hypertrophied.                           - No specimens collected. Moderate Sedation:      Moderate (conscious) sedation was administered by the endoscopy nurse       and supervised by the endoscopist. The following parameters were       monitored: oxygen saturation, heart rate, blood pressure, CO2       capnography and response to care. Total physician intraservice time was       22 minutes. Recommendation:           - Patient has a contact number available for                            emergencies. The signs and symptoms of potential                            delayed complications were discussed with the                            patient. Return to normal activities tomorrow.                             Written discharge instructions were provided to the                            patient.                           - High fiber diet today.                           - Continue present medications.                           - Repeat colonoscopy in 10 years for screening                            purposes.                           - Telephone GI clinic if symptomatic. Procedure Code(s):        --- Professional ---                           [phone], Colonoscopy, flexible; diagnostic, including                            collection of specimen(s) by brushing or washing,                            when performed (separate procedure)                           99152, Moderate sedation services provided by the  same physician or other qualified health care                            professional performing the diagnostic or                            therapeutic service that the sedation supports,                            requiring the presence of an independent trained                            observer to assist in the monitoring of the                            patient's level of consciousness and physiological                            status; initial 15 minutes of intraservice time,                            patient age 42 years or older Diagnosis Code(s):        --- Professional ---                           K64.4, Residual hemorrhoidal skin tags                           K62.89, Other specified diseases of anus and rectum                           K62.5, Hemorrhage of anus and rectum                           K57.30, Diverticulosis of large intestine without                            perforation or abscess without bleeding CPT copyright 2016 American Medical Association. All rights reserved. The codes documented in this report are preliminary and upon coder review may  be revised to meet current compliance requirements. Lionel DecemberNajeeb Edder Bellanca, MD Lionel DecemberNajeeb  Tore Carreker, MD 04/22/2016 9:58:12 AM This report has been signed electronically. Number of Addenda: 0

## 2016-04-25 ENCOUNTER — Encounter (HOSPITAL_COMMUNITY): Payer: Self-pay | Admitting: Internal Medicine

## 2016-09-03 IMAGING — US US BREAST LTD UNI RIGHT INC AXILLA
1 series · 6 of 6 positions shown · non-contrast
Comparison: 06/01/2015 and 05/29/2014

CLINICAL DATA: Possible mass right breast identified on recent
screening mammogram performed on a mobile unit.

EXAM:
DIGITAL DIAGNOSTIC RIGHT MAMMOGRAM WITH 3D TOMOSYNTHESIS WITH CAD
ULTRASOUND RIGHT BREAST

[Series 1: us breast ltd uni right inc axilla · 0.07mm/px · 6 of 6 slices shown]
[im 1/6]
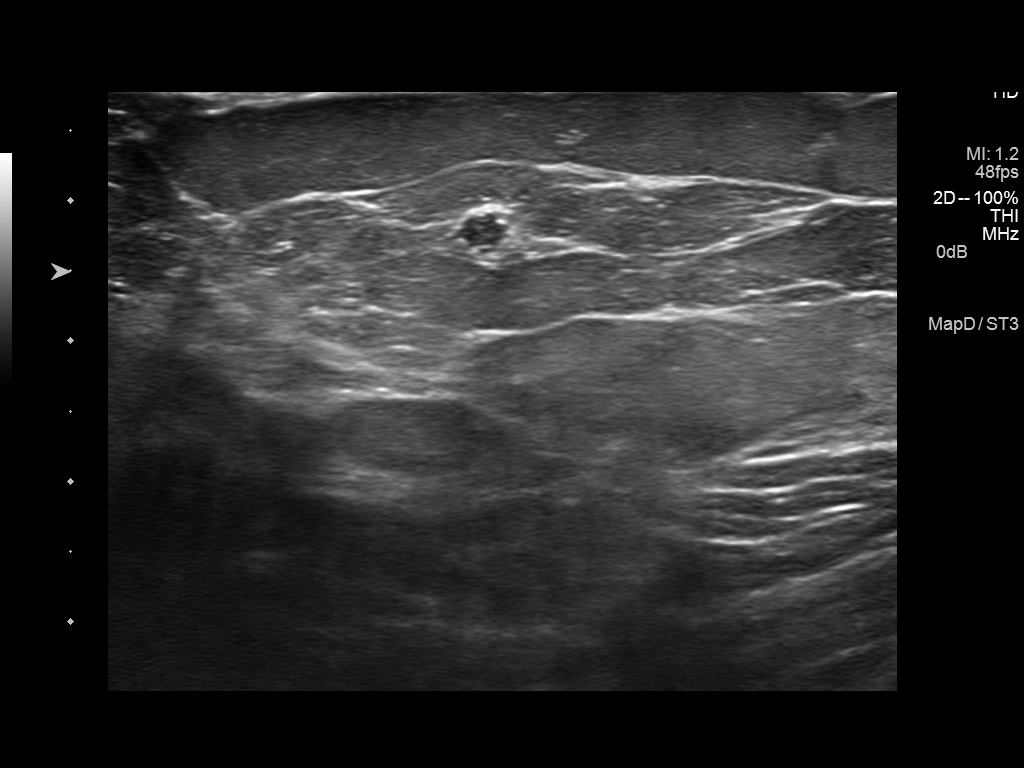
[im 2/6]
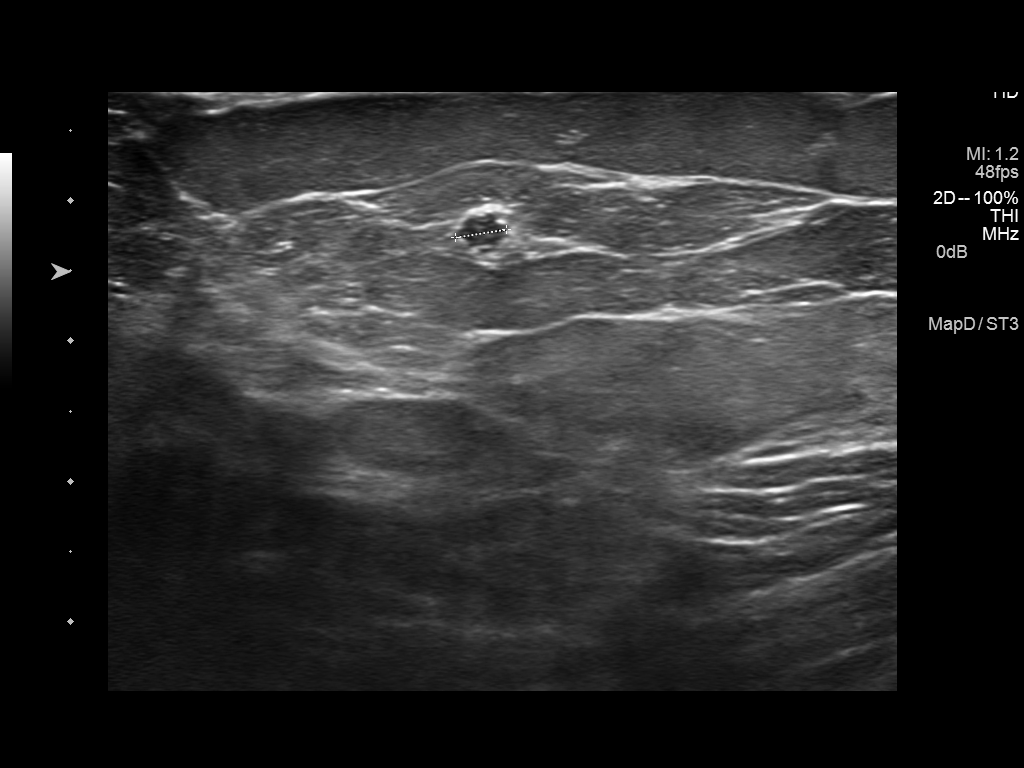
[im 3/6]
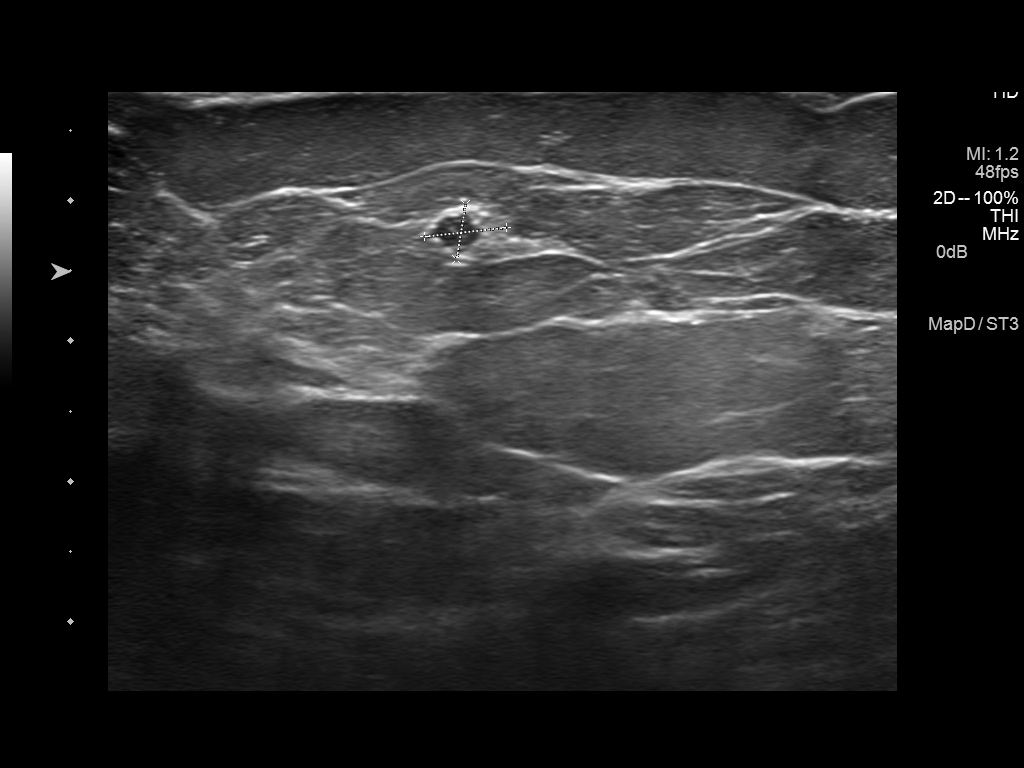
[im 4/6]
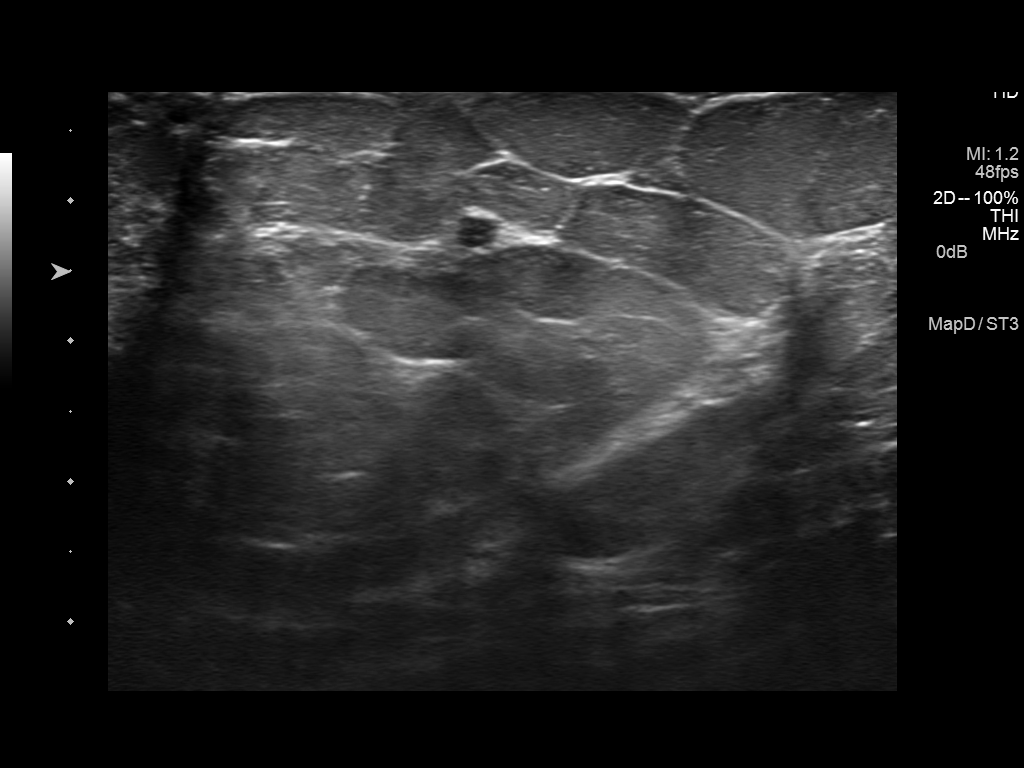
[im 5/6]
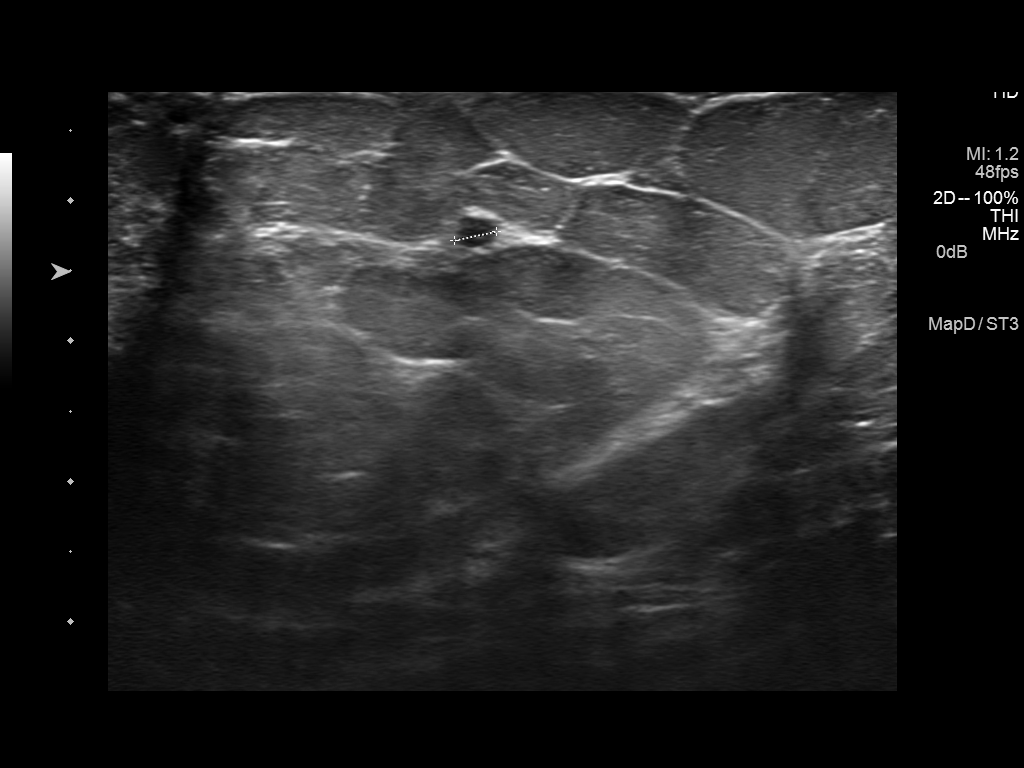
[im 6/6]
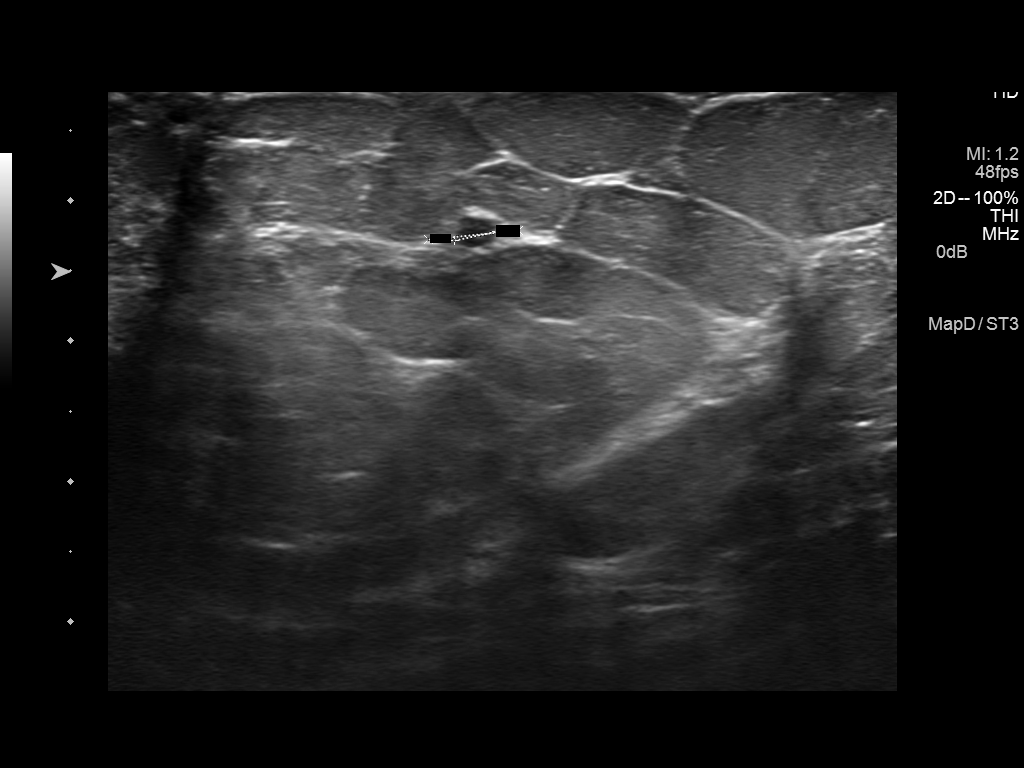

[6 of 6 positions shown; findings below may reference images not displayed]

ACR Breast Density Category b: There are scattered areas of
fibroglandular density.
FINDINGS: 3D tomographic images of the right breast MLO projections show a
circumscribed oval nodule in the retroareolar right breast.
Remainder of the parenchymal pattern appears stable compared to
prior mammograms. Negative for distortion.

Mammographic images were processed with CAD.

On physical exam, no mass is palpated in the retroareolar right
breast.

Targeted ultrasound is performed, showing an oval mildly complex
cyst at 6 o'clock position retroareolar measuring 6 x 4 x 6 mm. The
remainder of the retroareolar breast parenchyma is negative.
IMPRESSION: 6 mm mildly complex cyst retroareolar right breast. No evidence of
malignancy.

RECOMMENDATION:
Screening mammogram in one year.(Code:4L-[id number])

I have discussed the findings and recommendations with the patient.
Results were also provided in writing at the conclusion of the
visit. If applicable, a reminder letter will be sent to the patient
regarding the next appointment.

BI-RADS CATEGORY  2: Benign.

## 2016-12-27 IMAGING — NM NM MYOCAR MULTI W/SPECT W/WALL MOTION & EF
2 series · 12 of 12 positions shown · non-contrast
Comparison: none

[Series 1: rest · 8.28mm/px · 6 of 64 frames shown]
[frame 6/64]
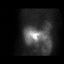
[frame 16/64]
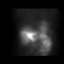
[frame 27/64]
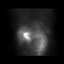
[frame 38/64]
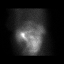
[frame 48/64]
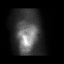
[frame 59/64]
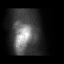

[Series 2: stress gated · 8.28mm/px · 6 of 64 frames shown]
[frame 6/64]
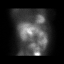
[frame 16/64]
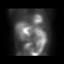
[frame 27/64]
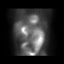
[frame 38/64]
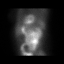
[frame 48/64]
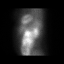
[frame 59/64]
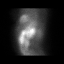

[12 of 12 positions shown; findings below may reference images not displayed]

Canned report from images found in remote index.

Refer to host system for actual result text.

## 2023-11-23 ENCOUNTER — Other Ambulatory Visit (HOSPITAL_COMMUNITY): Payer: Self-pay | Admitting: Nurse Practitioner

## 2023-11-23 ENCOUNTER — Encounter (HOSPITAL_COMMUNITY): Payer: Self-pay | Admitting: Nurse Practitioner

## 2023-11-23 DIAGNOSIS — R519 Headache, unspecified: Secondary | ICD-10-CM

## 2023-11-23 DIAGNOSIS — Z720 Tobacco use: Secondary | ICD-10-CM

## 2023-11-30 ENCOUNTER — Encounter (HOSPITAL_COMMUNITY): Payer: Self-pay | Admitting: Nurse Practitioner

## 2023-12-18 ENCOUNTER — Other Ambulatory Visit (HOSPITAL_COMMUNITY): Payer: Self-pay | Admitting: Nurse Practitioner

## 2023-12-18 DIAGNOSIS — Z720 Tobacco use: Secondary | ICD-10-CM

## 2023-12-18 DIAGNOSIS — R519 Headache, unspecified: Secondary | ICD-10-CM

## 2024-07-25 ENCOUNTER — Other Ambulatory Visit (HOSPITAL_COMMUNITY): Payer: Self-pay | Admitting: Nurse Practitioner

## 2024-07-25 DIAGNOSIS — R519 Headache, unspecified: Secondary | ICD-10-CM

## 2024-07-29 ENCOUNTER — Ambulatory Visit (HOSPITAL_COMMUNITY)
Admission: RE | Admit: 2024-07-29 | Discharge: 2024-07-29 | Disposition: A | Discharge status: Home / Self Care | Source: Ambulatory Visit | Attending: Nurse Practitioner | Admitting: Nurse Practitioner

## 2024-07-29 DIAGNOSIS — R519 Headache, unspecified: Secondary | ICD-10-CM | POA: Diagnosis present

## 2024-12-16 ENCOUNTER — Encounter: Payer: Self-pay | Admitting: *Deleted
